# Patient Record
Sex: Male | Born: 1955 | Hispanic: No | Marital: Married | Smoking: Never smoker
Health system: Southern US, Community
[De-identification: ages and names within clinical notes are randomized; demographics above are authoritative.]

---

## 2016-11-26 ENCOUNTER — Other Ambulatory Visit: Payer: Self-pay | Admitting: Family Medicine

## 2016-11-26 ENCOUNTER — Ambulatory Visit
Admission: RE | Admit: 2016-11-26 | Discharge: 2016-11-26 | Disposition: A | Discharge status: Home / Self Care | Payer: Worker's Compensation | Source: Ambulatory Visit | Attending: Family Medicine | Admitting: Family Medicine

## 2016-11-26 DIAGNOSIS — W19XXXA Unspecified fall, initial encounter: Secondary | ICD-10-CM

## 2016-11-26 DIAGNOSIS — R52 Pain, unspecified: Secondary | ICD-10-CM

## 2018-05-01 IMAGING — DX DG SACRUM/COCCYX 2+V
3 series · 3 of 3 positions shown · non-contrast
Comparison: None.

CLINICAL DATA: Injury to lower back and sacrum after fall 11/26/15

EXAM:
SACRUM AND COCCYX - 2+ VIEW

[dg sacrum/coccyx (1 of 3)]
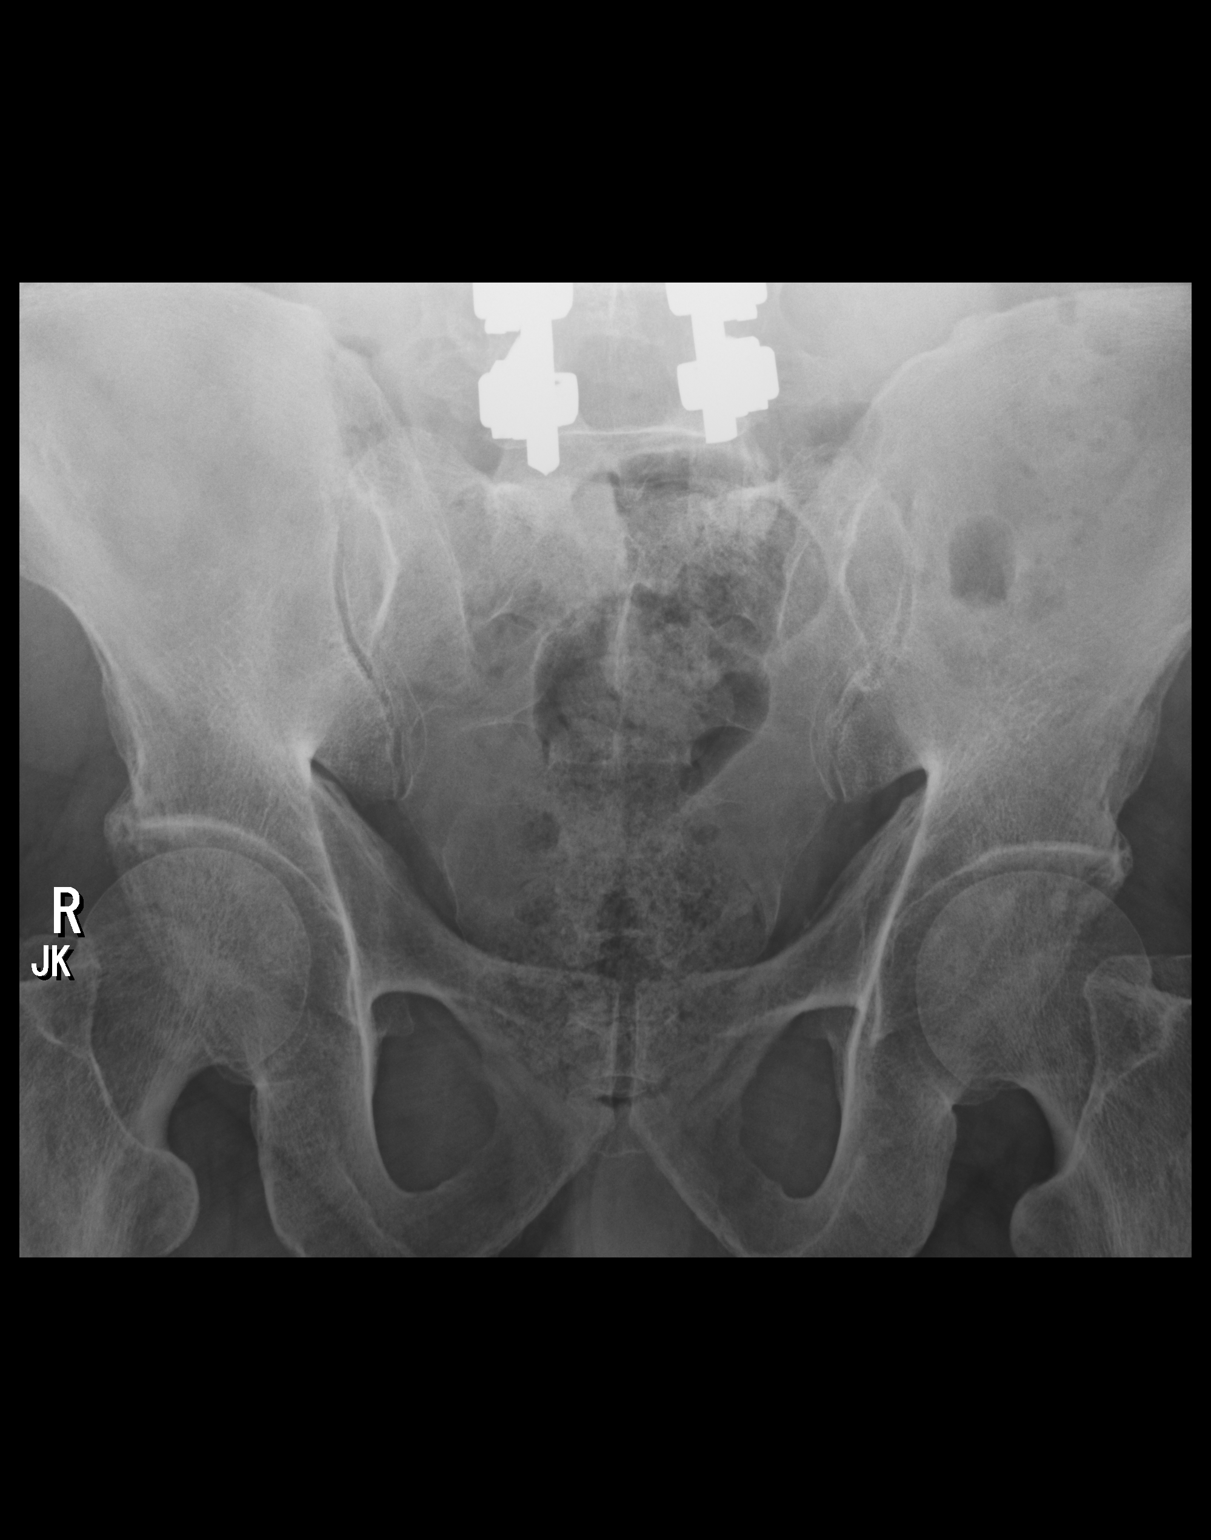

[dg sacrum/coccyx (2 of 3)]
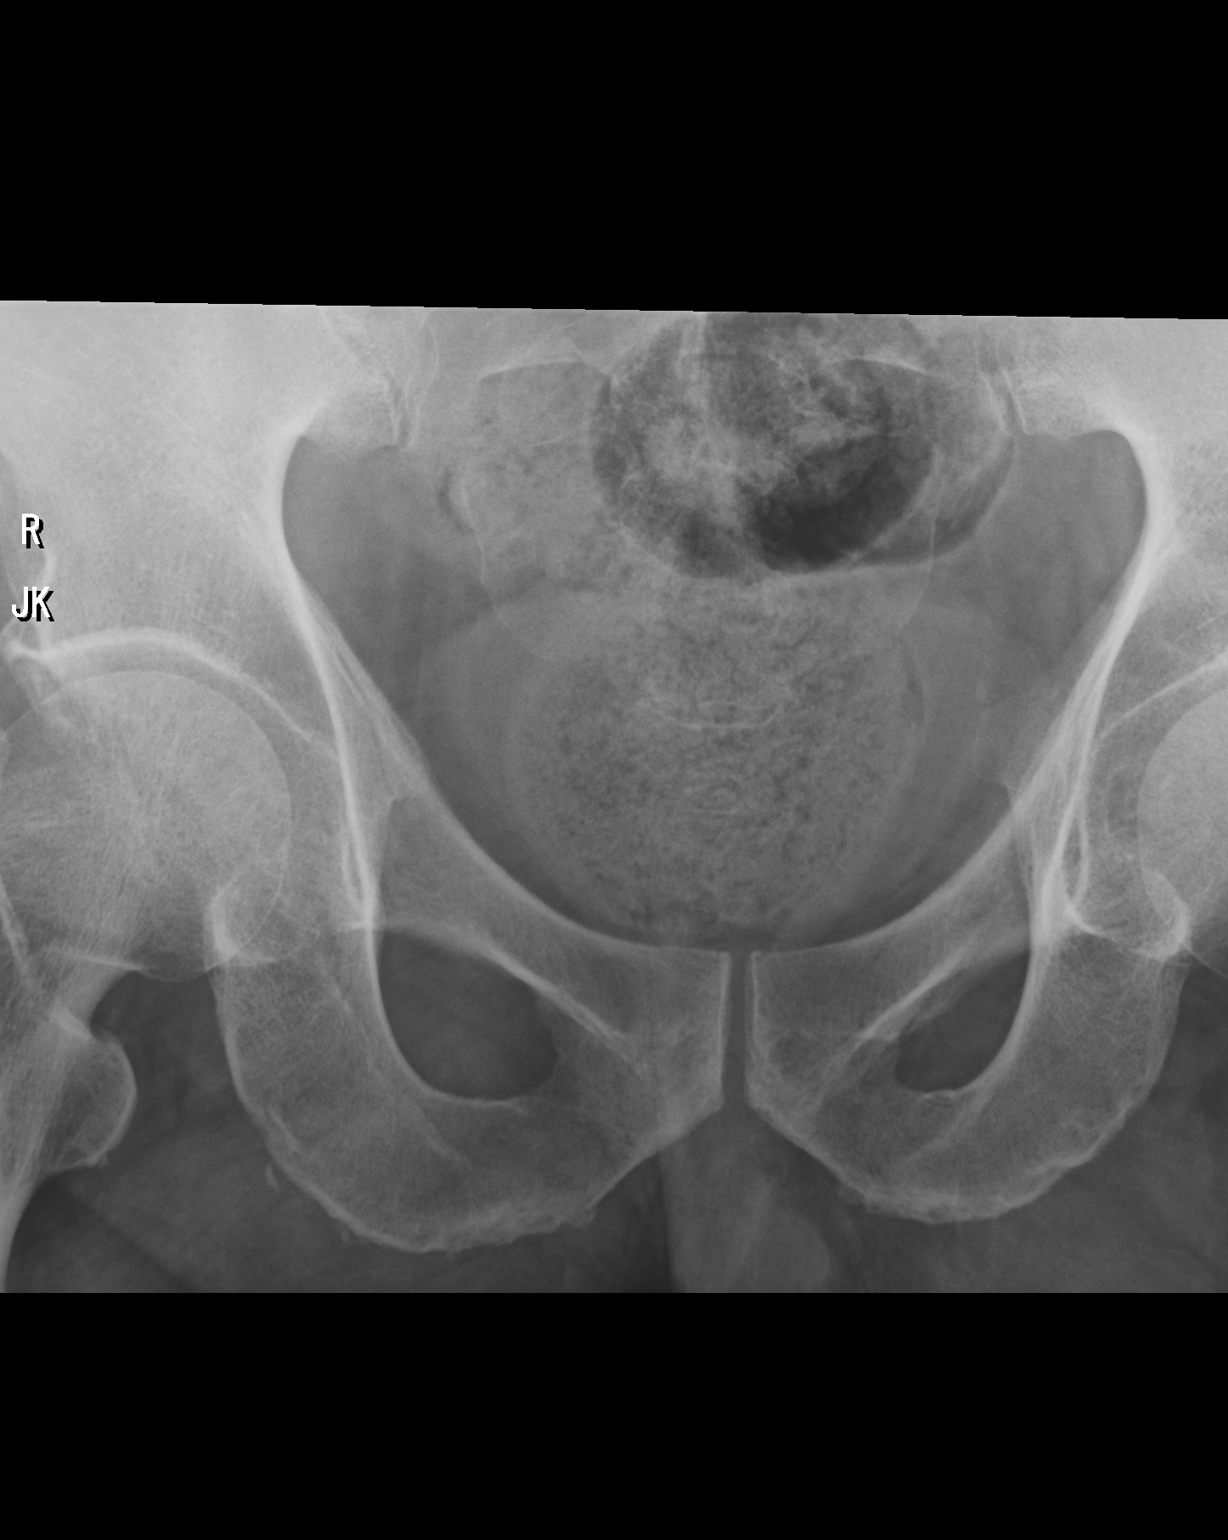

[dg sacrum/coccyx (3 of 3)]
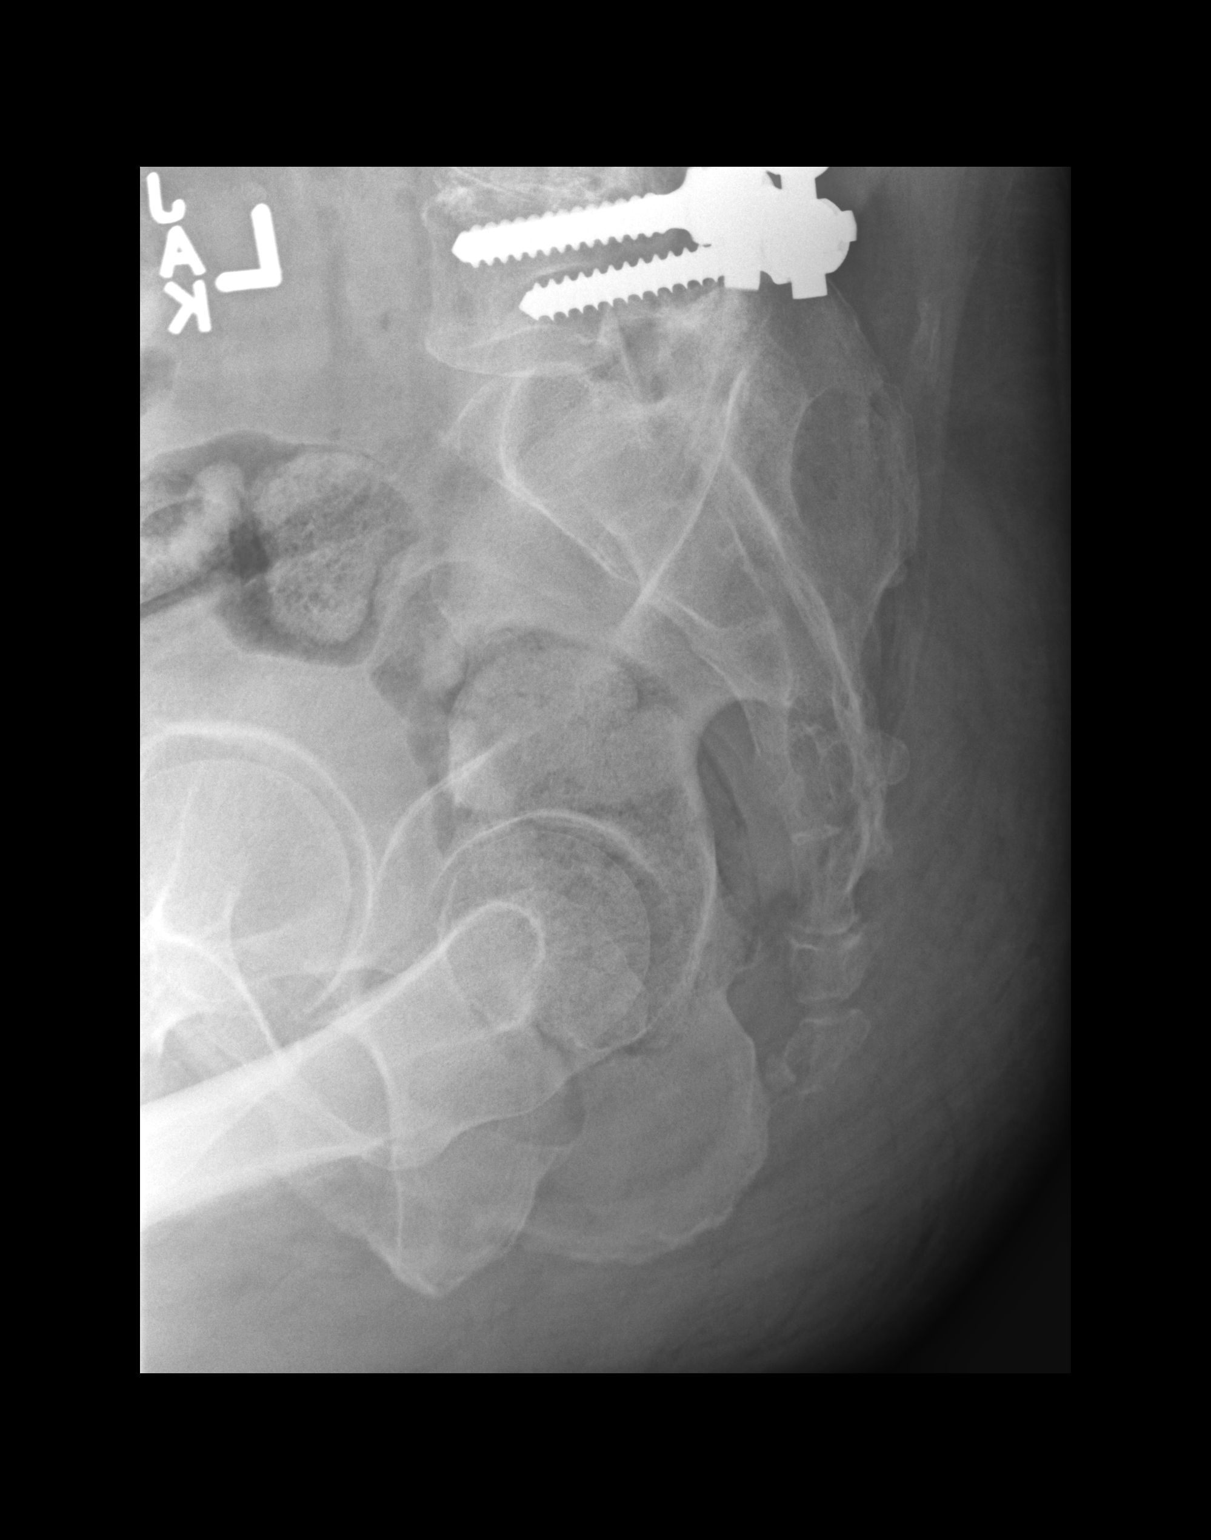

[3 of 3 positions shown; findings below may reference images not displayed]

FINDINGS: There is no evidence of fracture or other focal bone lesions.
IMPRESSION: Negative.

## 2021-04-15 ENCOUNTER — Ambulatory Visit: Payer: Self-pay | Admitting: Orthopaedic Surgery

## 2021-04-22 ENCOUNTER — Ambulatory Visit: Payer: Self-pay

## 2021-04-22 ENCOUNTER — Ambulatory Visit (INDEPENDENT_AMBULATORY_CARE_PROVIDER_SITE_OTHER): Payer: Worker's Compensation | Admitting: Orthopaedic Surgery

## 2021-04-22 ENCOUNTER — Encounter: Payer: Self-pay | Admitting: Orthopaedic Surgery

## 2021-04-22 VITALS — BP 185/81 | HR 107 | Ht 62.0 in | Wt 183.0 lb

## 2021-04-22 DIAGNOSIS — M542 Cervicalgia: Secondary | ICD-10-CM

## 2021-04-22 DIAGNOSIS — M25511 Pain in right shoulder: Secondary | ICD-10-CM | POA: Diagnosis not present

## 2021-04-22 NOTE — Progress Notes (Signed)
Office Visit Note   Patient: Ralph Montgomery           Date of Birth: 1956-04-18           MRN: [id number] Visit Date: 04/22/2021              Requested by: No referring provider defined for this encounter. PCP: No primary care provider on file.   Assessment & Plan: Visit Diagnoses:  1. Neck pain   2. 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   We will check patient back in 6 weeks.  He remains working regular hours without restrictions.  Follow-Up Instructions: Return in about 6 weeks (around 06/03/2021).   Orders:  Orders Placed This Encounter  Procedures   XR Cervical Spine 2 or 3 views   XR Shoulder Right   Ambulatory referral to Physical Therapy   No orders of the defined types were placed in this encounter.     Procedures: No procedures performed   Clinical Data: No additional findings.   Subjective: Chief Complaint  Patient presents with   Right Shoulder - Pain    OTJI 03/23/2021   Neck - Pain    OTJI 03/23/2021    HPI 65 year old male with an on-the-job injury on 03/23/2021 when he was sitting in a computer chair chair slipped back he fell backwards hit his head with landing on his right side with pain in his right upper extremity right arm radiating down to his head.  He states he has had some headaches difficulty raising his arm over his head and some numbness and tingling in his fingers.  He is right-hand dominant.  Past history of knife laceration just above the antecubital space with radial nerve repair 20 years ago and patient states he had later radial nerve tendon transfer from volar to dorsal for finger extension thumb extension.  Patient's continue to work without restrictions.  No past history of neck problems that he can recall.  He is using Flexeril at night without relief.  Patient is a diabetic on metformin and also insulin.  Review of Systems positive for  diabetes previous radial nerve repair right arm and tendon transfers 20 years ago.  All other systems noncontributory to HPI.   Objective: Vital Signs: BP (!) 185/81   Pulse (!) 107   Ht 5\' 2"  (1.575 m)   Wt 183 lb (83 kg)   BMI 33.47 kg/m   Physical Exam Constitutional:      Appearance: He is well-developed.  HENT:     Head: Normocephalic and atraumatic.     Right Ear: External ear normal.     Left Ear: External ear normal.  Eyes:     Pupils: Pupils are equal, round, and reactive to light.  Neck:     Thyroid: No thyromegaly.     Trachea: No tracheal deviation.  Cardiovascular:     Rate and Rhythm: Normal rate.  Pulmonary:     Effort: Pulmonary effort is normal.     Breath sounds: No wheezing.  Abdominal:     General: Bowel sounds are normal.     Palpations: Abdomen is soft.  Musculoskeletal:     Cervical back: Neck supple.  Skin:    General: Skin is warm and dry.     Capillary Refill: Capillary refill takes less than 2 seconds.  Neurological:     Mental Status:  He is alert and oriented to person, place, and time.  Psychiatric:        Behavior: Behavior normal.        Thought Content: Thought content normal.        Judgment: Judgment normal.    Ortho Exam patient has weakness fingers on extension.  There is mild atrophy mild weakness of interosseous on the right only normal on the left.  Have see you is active.  Good ring and per fundi flexion of the small finger.  Opposite left hand shows normal findings on sensorimotor exam radial median and ulnar.  Patient has brachial plexus tenderness on the right negative on the left.  Specialty Comments:  No specialty comments available.  Imaging: No results found.   PMFS History: There are no problems to display for this patient.  No past medical history on file.  No family history on file.   Social History   Occupational History   Not on file  Tobacco Use   Smoking status: Never    Passive exposure: Never    Smokeless tobacco: Never  Substance and Sexual Activity   Alcohol use: Never   Drug use: Not on file   Sexual activity: Not on file

## 2021-05-01 ENCOUNTER — Telehealth: Payer: Self-pay | Admitting: Orthopaedic Surgery

## 2021-05-01 NOTE — Telephone Encounter (Signed)
Pt called stating he is having a referral set up for PT but in the mean time he is still having pain in his arm and neck. Pt states he has to wait at least 1 more week before he can get a PT appt; so he is wanting to know if he can have something called in?  [phone]

## 2021-05-01 NOTE — Telephone Encounter (Signed)
Please advise 

## 2021-05-03 ENCOUNTER — Other Ambulatory Visit: Payer: Self-pay | Admitting: Orthopaedic Surgery

## 2021-05-04 ENCOUNTER — Other Ambulatory Visit: Payer: Self-pay | Admitting: Orthopaedic Surgery

## 2021-05-04 NOTE — Telephone Encounter (Signed)
noted 

## 2021-06-03 ENCOUNTER — Ambulatory Visit (INDEPENDENT_AMBULATORY_CARE_PROVIDER_SITE_OTHER): Payer: Worker's Compensation | Admitting: Orthopaedic Surgery

## 2021-06-03 ENCOUNTER — Encounter: Payer: Self-pay | Admitting: Orthopaedic Surgery

## 2021-06-03 VITALS — BP 159/91 | HR 92 | Ht 62.0 in | Wt 183.0 lb

## 2021-06-03 DIAGNOSIS — M25511 Pain in right shoulder: Secondary | ICD-10-CM | POA: Diagnosis not present

## 2021-06-03 DIAGNOSIS — M542 Cervicalgia: Secondary | ICD-10-CM | POA: Diagnosis not present

## 2021-06-03 MED ORDER — CYCLOBENZAPRINE HCL 10 MG PO TABS: 10.0000 mg | ORAL_TABLET | Freq: Every day | ORAL | 0 refills | Status: AC

## 2021-06-07 NOTE — Progress Notes (Signed)
Office Visit Note   Patient: Ralph Montgomery           Date of Birth: January 28, 1956           MRN: [id number] Visit Date: 06/03/2021              Requested by: No referring provider defined for this encounter. PCP: No primary care provider on file.   Assessment & Plan: Visit Diagnoses:  1. Neck pain   2. Acute pain of right shoulder     Plan: Flexeril 1 p.o. nightly 40 tablets prescribed.  Would recommend proceeding with an MRI scan for evaluating for cervical spondylosis C5-6 on the right post fall 03/23/2021.  Office follow-up after MRI scan.  Follow-Up Instructions: No follow-ups on file.   Orders:  Orders Placed This Encounter  Procedures   MR Cervical Spine w/o contrast   Meds ordered this encounter  Medications   cyclobenzaprine (FLEXERIL) 10 MG tablet    Sig: Take 1 tablet (10 mg total) by mouth at bedtime.    Dispense:  40 tablet    Refill:  0    Workers comp injury      Procedures: No procedures performed   Clinical Data: No additional findings.   Subjective: Chief Complaint  Patient presents with   Right Shoulder - Pain, Follow-up    OTJI 03/23/2021   Neck - Pain, Follow-up    HPI 65 year old male returns with ongoing problems with persistent neck pain that radiates into his right shoulder.  Patient's been through physical therapy states he is continuing to have significant pain and had On-the-job injury on 03/23/2021 with a computer chair slipped he fell backwards hitting his head landing on his right side.  He does have old history of radial nerve injury with repair 20 years ago later radial nerve transfers.  Patient states the pain radiates from his neck into his shoulder and down into his arm he is also having posterior primarily right side headaches. Review of Systems positive for diabetes previous tendon transfers 20+ years ago all other systems noncontributory.  No gait problems.   Objective: Vital Signs: BP (!) 159/91   Pulse 92   Ht 5\' 2"  (1.575  m)   Wt 183 lb (83 kg)   BMI 33.47 kg/m   Physical Exam Constitutional:      Appearance: He is well-developed.  HENT:     Head: Normocephalic and atraumatic.     Right Ear: External ear normal.     Left Ear: External ear normal.  Eyes:     Pupils: Pupils are equal, round, and reactive to light.  Neck:     Thyroid: No thyromegaly.     Trachea: No tracheal deviation.  Cardiovascular:     Rate and Rhythm: Normal rate.  Pulmonary:     Effort: Pulmonary effort is normal.     Breath sounds: No wheezing.  Abdominal:     General: Bowel sounds are normal.     Palpations: Abdomen is soft.  Musculoskeletal:     Cervical back: Neck supple.  Skin:    General: Skin is warm and dry.     Capillary Refill: Capillary refill takes less than 2 seconds.  Neurological:     Mental Status: He is alert and oriented to person, place, and time.  Psychiatric:        Behavior: Behavior normal.        Thought Content: Thought content normal.        Judgment: Judgment  normal.    Ortho Exam patient has right wrist finger extension mild atrophy interossei on the right normal left hand reflexes 2+ and symmetrical.  Flexors to the DIP joint ring and small are normal.  Brachial plexus tenderness on the right negative on the left positive Spurling on the right.  Specialty Comments:  No specialty comments available.  Imaging: No results found.   PMFS History: There are no problems to display for this patient.  No past medical history on file.  No family history on file.   Social History   Occupational History   Not on file  Tobacco Use   Smoking status: Never    Passive exposure: Never   Smokeless tobacco: Never  Substance and Sexual Activity   Alcohol use: Never   Drug use: Not on file   Sexual activity: Not on file

## 2021-07-06 ENCOUNTER — Telehealth: Payer: Self-pay | Admitting: Orthopaedic Surgery

## 2021-07-06 NOTE — Telephone Encounter (Signed)
Pt doesn't want to wait for appointment. He wants Ophelia Charter to call him and give him the results.

## 2021-07-06 NOTE — Telephone Encounter (Signed)
Pt wanting mri results.  CB [phone]

## 2021-07-07 NOTE — Telephone Encounter (Signed)
Called and LM to go over mri results!

## 2021-08-07 ENCOUNTER — Encounter: Payer: Self-pay | Admitting: Orthopaedic Surgery

## 2021-08-07 ENCOUNTER — Ambulatory Visit (INDEPENDENT_AMBULATORY_CARE_PROVIDER_SITE_OTHER): Payer: Worker's Compensation | Admitting: Orthopaedic Surgery

## 2021-08-07 ENCOUNTER — Other Ambulatory Visit: Payer: Self-pay

## 2021-08-07 DIAGNOSIS — M4802 Spinal stenosis, cervical region: Secondary | ICD-10-CM

## 2021-08-07 DIAGNOSIS — M4722 Other spondylosis with radiculopathy, cervical region: Secondary | ICD-10-CM | POA: Diagnosis not present

## 2021-08-07 NOTE — Progress Notes (Signed)
Office Visit Note   Patient: Ralph Montgomery           Date of Birth: 1956/09/30           MRN: [id number] Visit Date: 08/07/2021              Requested by: No referring provider defined for this encounter. PCP: No primary care provider on file.   Assessment & Plan: Visit Diagnoses:  1. Other spondylosis with radiculopathy, cervical region   2. Foraminal stenosis of cervical region     Plan: MRI cervical spine images are reviewed and report was reviewed with patient.  He had an on-the-job injury but has chronic degenerative changes in his cervical spine with foraminal stenosis and spondylosis single level at C5-6.  Discussed with patient that the spurs that are present are pre-existing to his fall.  Single level C5-6 anterior cervical discectomy and fusion with allograft and plate is recommended.  He understands he be out of work for 6 to 7 weeks after the procedure.  After that time he should be able to do regular work no restrictions.  Patient given a work note that he can continue work with the same restrictions and that he is being scheduled for cervical  fusion single level at C5-6.   Follow-Up Instructions: No follow-ups on file.   Orders:  No orders of the defined types were placed in this encounter.  No orders of the defined types were placed in this encounter.     Procedures: No procedures performed   Clinical Data: No additional findings.   Subjective: Chief Complaint  Patient presents with   Neck - Follow-up, Pain    MRI review    HPI 65 year old male returns with ongoing problems with severe neck pain with right arm radicular symptoms with Modic degenerative changes at C5-6 and moderate foraminal stenosis biforaminal.  Patient states he is also had some low back pain and some pain that radiates into his right leg but nothing as severe as the neck pain which has gradually progressed.  Patient had on-the-job injury on 03/23/2021 when he sitting in a computer chair  tipped back fell backwards hitting his head and right side his upper extremity.  Today we reviewed the MRI scan which shows chronic degenerative changes no acute fracture and there is spondylosis with biforaminal stenosis.  Patient's been treated with muscle relaxants anti-inflammatories, pain medication.  Previous history of radial nerve repair right arm with tendon transfer 20 years ago.  He been treated conservatively now for 4 months without improvement and MRI is available today for review.  Patient does have diabetes patient states pain at night is significant and he states at times he almost feels like he is in tears.  Review of Systems all other systems noncontributory other than mentioned as above.   Objective: Vital Signs: BP (!) 162/94   Ht 5\' 2"  (1.575 m)   Wt 183 lb (83 kg)   BMI 33.47 kg/m   Physical Exam Constitutional:      Appearance: He is well-developed.  HENT:     Head: Normocephalic and atraumatic.     Right Ear: External ear normal.     Left Ear: External ear normal.  Eyes:     Pupils: Pupils are equal, round, and reactive to light.  Neck:     Thyroid: No thyromegaly.     Trachea: No tracheal deviation.  Cardiovascular:     Rate and Rhythm: Normal rate.  Pulmonary:  Effort: Pulmonary effort is normal.     Breath sounds: No wheezing.  Abdominal:     General: Bowel sounds are normal.     Palpations: Abdomen is soft.  Musculoskeletal:     Cervical back: Neck supple.  Skin:    General: Skin is warm and dry.     Capillary Refill: Capillary refill takes less than 2 seconds.  Neurological:     Mental Status: He is alert and oriented to person, place, and time.  Psychiatric:        Behavior: Behavior normal.        Thought Content: Thought content normal.        Judgment: Judgment normal.    Ortho ExamHe.  Patient has weakness on finger extension on the right hand.  Mild atrophy of the interossei.  Positive Spurling on the right negative on the left.   Brachial plexus tenderness on the left moderate.  Negative Lhermitte.  Left hand shows normal sensation and intact reflexes.  Healed scars right forearm from tendon transfers.  Specialty Comments:  No specialty comments available.  Imaging:  FINDINGS:  #  Vertebral bodies: Normal height and alignment.  #  Marrow signal: Type I Modic changes in the vertebral bodies adjacent to the C5-6 disc.  #  Cervical spinal cord: No mass or abnormal signal intensity.  No spinal cord compression.  #  Craniovertebral junction: Normal.  #     #  C2-3: Unremarkable   #  C3-4: Unremarkable   #  C4-5: Unremarkable   #  C5-6: Moderate degenerative disc disease. Bilateral uncovertebral joint spurs. Moderate bilateral foraminal stenosis.   #  C6-7: Unremarkable   #  C7-T1: Unremarkable Procedure Note  Bettey Mare, MD - 06/30/2021  Formatting of this note might be different from the original.  MRI cervical spine without contrast:   INDICATION:  Neck pain radiating into the right arm.   TECHNIQUE: Sagittal T1, T2, and STIR images were performed. Axial T2-weighted images.   COMPARISON:  None available   FINDINGS:  #  Vertebral bodies: Normal height and alignment.  #  Marrow signal: Type I Modic changes in the vertebral bodies adjacent to the C5-6 disc.  #  Cervical spinal cord: No mass or abnormal signal intensity.  No spinal cord compression.  #  Craniovertebral junction: Normal.  #     #  C2-3: Unremarkable   #  C3-4: Unremarkable   #  C4-5: Unremarkable   #  C5-6: Moderate degenerative disc disease. Bilateral uncovertebral joint spurs. Moderate bilateral foraminal stenosis.   #  C6-7: Unremarkable   #  C7-T1: Unremarkable    IMPRESSION:  Moderate spondylosis C5-6 with moderate bilateral foraminal stenosis.   Electronically Signed by: Orlean Patten on 06/30/2021 12:00 PM   PMFS History: Patient Active Problem List   Diagnosis Date Noted   Other spondylosis with  radiculopathy, cervical region 08/10/2021   Foraminal stenosis of cervical region 08/10/2021   No past medical history on file.  No family history on file.   Social History   Occupational History   Not on file  Tobacco Use   Smoking status: Never    Passive exposure: Never   Smokeless tobacco: Never  Substance and Sexual Activity   Alcohol use: Never   Drug use: Not on file   Sexual activity: Not on file

## 2021-08-10 DIAGNOSIS — M4722 Other spondylosis with radiculopathy, cervical region: Secondary | ICD-10-CM | POA: Insufficient documentation

## 2021-08-10 DIAGNOSIS — M4802 Spinal stenosis, cervical region: Secondary | ICD-10-CM | POA: Insufficient documentation

## 2021-09-23 ENCOUNTER — Other Ambulatory Visit: Payer: Self-pay | Admitting: Orthopaedic Surgery

## 2021-09-23 ENCOUNTER — Telehealth: Payer: Self-pay | Admitting: Orthopaedic Surgery

## 2021-09-23 NOTE — Telephone Encounter (Signed)
Please advise 

## 2021-09-23 NOTE — Telephone Encounter (Signed)
Pt called requesting a refill of hydrocodone. Please send to pharmacy on file. Pt phone number is [phone]

## 2021-09-24 ENCOUNTER — Other Ambulatory Visit: Payer: Self-pay | Admitting: Orthopaedic Surgery

## 2021-09-24 NOTE — Telephone Encounter (Signed)
I called, no answer. Will wait to advise if patient returns call.

## 2022-01-15 ENCOUNTER — Other Ambulatory Visit (HOSPITAL_COMMUNITY): Payer: Self-pay | Admitting: Cardiovascular Disease

## 2022-01-15 DIAGNOSIS — R0789 Other chest pain: Secondary | ICD-10-CM

## 2022-04-01 ENCOUNTER — Ambulatory Visit (HOSPITAL_COMMUNITY): Admission: RE | Admit: 2022-04-01 | Payer: Self-pay | Source: Ambulatory Visit
